# Patient Record
Sex: Female | Born: 1990 | Race: White | Hispanic: No | Marital: Single | Smoking: Former smoker
Health system: Southern US, Community
[De-identification: ages and names within clinical notes are randomized; demographics above are authoritative.]

## PROBLEM LIST (undated history)

## (undated) DIAGNOSIS — O0990 Supervision of high risk pregnancy, unspecified, unspecified trimester: Secondary | ICD-10-CM

---

## 2014-06-14 ENCOUNTER — Emergency Department (HOSPITAL_COMMUNITY)
Admission: EM | Admit: 2014-06-14 | Discharge: 2014-06-14 | Disposition: A | Discharge status: Home / Self Care | Payer: Medicaid - Out of State | Attending: Emergency Medicine | Admitting: Emergency Medicine

## 2014-06-14 ENCOUNTER — Encounter (HOSPITAL_COMMUNITY): Payer: Self-pay | Admitting: Emergency Medicine

## 2014-06-14 ENCOUNTER — Emergency Department (HOSPITAL_COMMUNITY): Payer: Medicaid - Out of State

## 2014-06-14 DIAGNOSIS — Z79899 Other long term (current) drug therapy: Secondary | ICD-10-CM | POA: Diagnosis not present

## 2014-06-14 DIAGNOSIS — O209 Hemorrhage in early pregnancy, unspecified: Secondary | ICD-10-CM | POA: Diagnosis not present

## 2014-06-14 DIAGNOSIS — Z87891 Personal history of nicotine dependence: Secondary | ICD-10-CM | POA: Insufficient documentation

## 2014-06-14 DIAGNOSIS — Z3A14 14 weeks gestation of pregnancy: Secondary | ICD-10-CM | POA: Insufficient documentation

## 2014-06-14 HISTORY — DX: Supervision of high risk pregnancy, unspecified, unspecified trimester: O09.90

## 2014-06-14 LAB — CBC WITH DIFFERENTIAL/PLATELET
Basophils Absolute: 0 10*3/uL (ref 0.0–0.1)
Basophils Relative: 0 % (ref 0–1)
Eosinophils Absolute: 0.1 10*3/uL (ref 0.0–0.7)
Eosinophils Relative: 0 % (ref 0–5)
HEMATOCRIT: 35.3 % — AB (ref 36.0–46.0)
HEMOGLOBIN: 12 g/dL (ref 12.0–15.0)
Lymphocytes Relative: 20 % (ref 12–46)
Lymphs Abs: 2.7 10*3/uL (ref 0.7–4.0)
MCH: 30 pg (ref 26.0–34.0)
MCHC: 34 g/dL (ref 30.0–36.0)
MCV: 88.3 fL (ref 78.0–100.0)
MONOS PCT: 6 % (ref 3–12)
Monocytes Absolute: 0.8 10*3/uL (ref 0.1–1.0)
NEUTROS ABS: 10.1 10*3/uL — AB (ref 1.7–7.7)
Neutrophils Relative %: 74 % (ref 43–77)
Platelets: 238 10*3/uL (ref 150–400)
RBC: 4 MIL/uL (ref 3.87–5.11)
RDW: 13 % (ref 11.5–15.5)
WBC: 13.7 10*3/uL — ABNORMAL HIGH (ref 4.0–10.5)

## 2014-06-14 LAB — WET PREP, GENITAL
CLUE CELLS WET PREP: NONE SEEN
Trich, Wet Prep: NONE SEEN
YEAST WET PREP: NONE SEEN

## 2014-06-14 LAB — BASIC METABOLIC PANEL
Anion gap: 5 (ref 5–15)
BUN: 8 mg/dL (ref 6–23)
CALCIUM: 9.1 mg/dL (ref 8.4–10.5)
CO2: 24 mmol/L (ref 19–32)
Chloride: 107 mmol/L (ref 96–112)
Creatinine, Ser: 0.61 mg/dL (ref 0.50–1.10)
GFR calc Af Amer: >90 mL/min (ref >90)
GLUCOSE: 99 mg/dL (ref 70–99)
Potassium: 3.4 mmol/L — ABNORMAL LOW (ref 3.5–5.1)
Sodium: 136 mmol/L (ref 135–145)

## 2014-06-14 LAB — URINALYSIS, ROUTINE W REFLEX MICROSCOPIC
Bilirubin Urine: NEGATIVE
Glucose, UA: NEGATIVE mg/dL
HGB URINE DIPSTICK: NEGATIVE
Ketones, ur: NEGATIVE mg/dL
Nitrite: NEGATIVE
PH: 7 (ref 5.0–8.0)
PROTEIN: NEGATIVE mg/dL
Specific Gravity, Urine: 1.02 (ref 1.005–1.030)
Urobilinogen, UA: 0.2 mg/dL (ref 0.0–1.0)

## 2014-06-14 LAB — URINE MICROSCOPIC-ADD ON

## 2014-06-14 LAB — HCG, QUANTITATIVE, PREGNANCY: HCG, BETA CHAIN, QUANT, S: 53193 m[IU]/mL — AB (ref <5)

## 2014-06-14 LAB — PROTIME-INR
INR: 1.03 (ref 0.00–1.49)
Prothrombin Time: 13.6 seconds (ref 11.6–15.2)

## 2014-06-14 NOTE — ED Notes (Signed)
Pt states that she is 4 months pregnant.  Pt states that last night, she began having sharp, low abd pain with light pink spotting and since then feels like she has been "peeing on herself consistently".

## 2014-06-14 NOTE — Discharge Instructions (Signed)
Vaginal Bleeding During Pregnancy, First Trimester  A small amount of bleeding (spotting) from the vagina is relatively common in early pregnancy. It usually stops on its own. Various things may cause bleeding or spotting in early pregnancy. Some bleeding may be related to the pregnancy, and some may not. In most cases, the bleeding is normal and is not a problem. However, bleeding can also be a sign of something serious. Be sure to tell your health care provider about any vaginal bleeding right away.  Some possible causes of vaginal bleeding during the first trimester include:  · Infection or inflammation of the cervix.  · Growths (polyps) on the cervix.  · Miscarriage or threatened miscarriage.  · Pregnancy tissue has developed outside of the uterus and in a fallopian tube (tubal pregnancy).  · Tiny cysts have developed in the uterus instead of pregnancy tissue (molar pregnancy).  HOME CARE INSTRUCTIONS   Watch your condition for any changes. The following actions may help to lessen any discomfort you are feeling:  · Follow your health care provider's instructions for limiting your activity. If your health care provider orders bed rest, you may need to stay in bed and only get up to use the bathroom. However, your health care provider may allow you to continue light activity.  · If needed, make plans for someone to help with your regular activities and responsibilities while you are on bed rest.  · Keep track of the number of pads you use each day, how often you change pads, and how soaked (saturated) they are. Write this down.  · Do not use tampons. Do not douche.  · Do not have sexual intercourse or orgasms until approved by your health care provider.  · If you pass any tissue from your vagina, save the tissue so you can show it to your health care provider.  · Only take over-the-counter or prescription medicines as directed by your health care provider.  · Do not take aspirin because it can make you  bleed.  · Keep all follow-up appointments as directed by your health care provider.  SEEK MEDICAL CARE IF:  · You have any vaginal bleeding during any part of your pregnancy.  · You have cramps or labor pains.  · You have a fever, not controlled by medicine.  SEEK IMMEDIATE MEDICAL CARE IF:   · You have severe cramps in your back or belly (abdomen).  · You pass large clots or tissue from your vagina.  · Your bleeding increases.  · You feel light-headed or weak, or you have fainting episodes.  · You have chills.  · You are leaking fluid or have a gush of fluid from your vagina.  · You pass out while having a bowel movement.  MAKE SURE YOU:  · Understand these instructions.  · Will watch your condition.  · Will get help right away if you are not doing well or get worse.  Document Released: 01/29/2005 Document Revised: 04/26/2013 Document Reviewed: 12/27/2012  [employer]® Patient Information ©2015 [employer]. This information is not intended to replace advice given to you by your health care provider. Make sure you discuss any questions you have with your health care provider.

## 2014-06-14 NOTE — ED Provider Notes (Signed)
CSN: [id number]     Arrival date & time 06/14/14  1644 History   First MD Initiated Contact with Patient 06/14/14 1706     Chief Complaint  Patient presents with  . Abdominal Pain  . 4 months Pregnant   . Vaginal Discharge     (Consider location/radiation/quality/duration/timing/severity/associated sxs/prior Treatment) HPI  Jean Walters is a(n) 24 y.o. female who presents for pain, vaginal bleeding and fluid leaking from vagina. Last known menstrual period was October 20. She is G2P0010. The patient states that last night she began having cramping in the lower abdomen and bilateral lower quadrants. She's specifies the uterus and her ovarian area. She states she was this was followed by about an hour of spotting and she began leaking clear fluid from her vagina. Specifically, she denies urinating on herself. Is from [place] and has had prenatal care in [place]. Patient states that she's had no other major problems with the pregnancy except for morning sickness. She is prescribed promethazine and states she occasionally takes 1 every few days. She denies any consistent vomiting. She is in a monogamous relationship with one partner. Denies any possibility of this sexually transmitted illness. She has no history of STDs. She denies any other drug or alcohol use except for occasional marijuana. She denies constipation, diarrhea.  Past Medical History  Diagnosis Date  . High-risk pregnancy    History reviewed. No pertinent past surgical history. History reviewed. No pertinent family history. History  Substance Use Topics  . Smoking status: Former Smoker    Types: Cigarettes  . Smokeless tobacco: Not on file  . Alcohol Use: No   OB History    Gravida Para Term Preterm AB TAB SAB Ectopic Multiple Living   1              Review of Systems  Ten systems reviewed and are negative for acute change, except as noted in the HPI.    Allergies  Review of patient's allergies indicates no known  allergies.  Home Medications   Prior to Admission medications   Medication Sig Start Date End Date Taking? Authorizing Provider  Prenatal Vit-Fe Fumarate-FA (PRENATAL MULTIVITAMIN) TABS tablet Take 1 tablet by mouth daily at 12 noon.   Yes Historical Provider, MD  promethazine (PHENERGAN) 25 MG tablet Take 25 mg by mouth every 6 (six) hours as needed for nausea or vomiting.   Yes Historical Provider, MD   BP 92/63 mmHg  Pulse 73  Temp(Src) 98.1 F (36.7 C) (Oral)  Resp 18  SpO2 100%  LMP 02/11/2014 Physical Exam  Constitutional: She is oriented to person, place, and time. She appears well-developed and well-nourished. No distress.  HENT:  Head: Normocephalic and atraumatic.  Eyes: Conjunctivae are normal. No scleral icterus.  Neck: Normal range of motion.  Cardiovascular: Normal rate, regular rhythm and normal heart sounds.  Exam reveals no gallop and no friction rub.   No murmur heard. Pulmonary/Chest: Effort normal and breath sounds normal. No respiratory distress.  Abdominal: Soft. Bowel sounds are normal. She exhibits no distension and no mass. There is no tenderness. There is no guarding.  Genitourinary:  Pelvic exam: normal external genitalia, vulva, vagina, cervix,Gravid uterus and normal adnexa.   Neurological: She is alert and oriented to person, place, and time.  Skin: Skin is warm and dry. She is not diaphoretic.  Nursing note and vitals reviewed.     ED Course  Procedures (including critical care time) Labs Review Labs Reviewed  CBC WITH DIFFERENTIAL/PLATELET -  Abnormal; Notable for the following:    WBC 13.7 (*)    HCT 35.3 (*)    Neutro Abs 10.1 (*)    All other components within normal limits  WET PREP, GENITAL  PROTIME-INR  BASIC METABOLIC PANEL  URINALYSIS, ROUTINE W REFLEX MICROSCOPIC  HCG, QUANTITATIVE, PREGNANCY  HIV ANTIBODY (ROUTINE TESTING)  RPR  ABO/RH  GC/CHLAMYDIA PROBE AMP ([place])    Imaging Review No results found.   EKG  Interpretation None      MDM   Final diagnoses:  Vaginal bleeding in pregnancy, first trimester    6:10 PM BP 92/63 mmHg  Pulse 73  Temp(Src) 98.1 F (36.7 C) (Oral)  Resp 18  SpO2 100%  LMP 02/11/2014  Patient with slight elevation in he white count. Awaiting transvaginal ultrasound and labs.   7:34 PM Patient with contaminated urine, wet prep is unremarkable. She has a mildly elevated white count. Mild hyponatremia. HCG is elevated in the range systolic by her estimated gestational age. Ultrasound shows single 14 week daily living IUP with normal placenta and normal amniotic fluid. The patient states she is Rh- (O- blood type) was given prophylactic on 05/10/2014. She produces paper as proof of her injection.   Patient appears safe for discharge at this point. I have reviewed the case with Dr. Silverio LayYAO who aggrees with assessment and POC. She appears safe fro discharge at this time to follow up with her OB gyn.  Arthor Captainbigail Lateshia Schmoker, PA-C 06/18/14 1106  Richardean Canalavid H Yao, MD 06/18/14 [phone]

## 2014-06-15 LAB — ABO/RH
ABO/RH(D): O NEG
Antibody Screen: POSITIVE
DAT, IgG: NEGATIVE

## 2014-06-15 LAB — HIV ANTIBODY (ROUTINE TESTING W REFLEX): HIV Screen 4th Generation wRfx: NONREACTIVE

## 2014-06-15 LAB — GC/CHLAMYDIA PROBE AMP (~~LOC~~) NOT AT ARMC
CHLAMYDIA, DNA PROBE: NEGATIVE
Neisseria Gonorrhea: NEGATIVE

## 2014-06-15 LAB — RPR: RPR Ser Ql: NONREACTIVE

## 2016-02-19 IMAGING — US US OB LIMITED
1 series · 14 of 18 positions shown · non-contrast
Comparison: none

CLINICAL DATA: Pelvic pain and bleeding.

EXAM:
LIMITED OBSTETRIC ULTRASOUND

[Series 1: us ob limited · 0.21mm/px · 18 acquisitions, 14 frames shown]
[im 1/18]
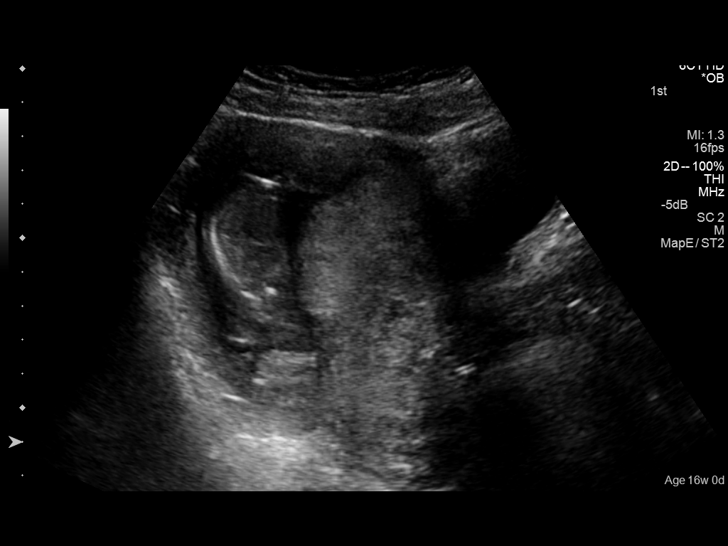
[im 2/18]
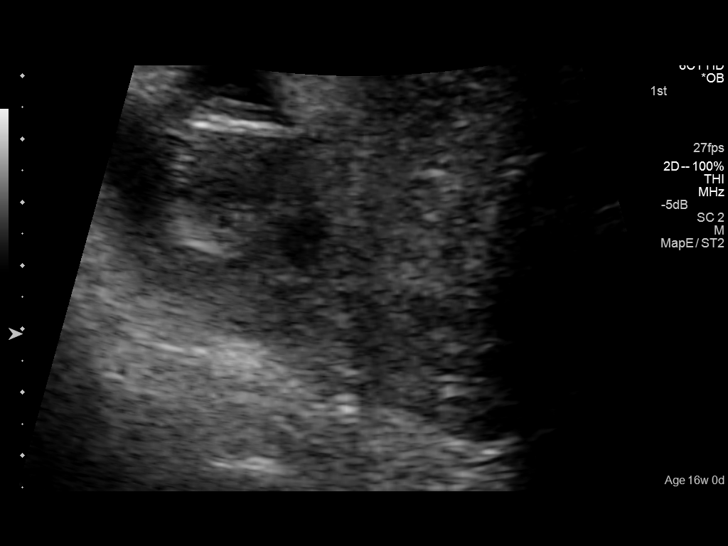
[im 4/18]
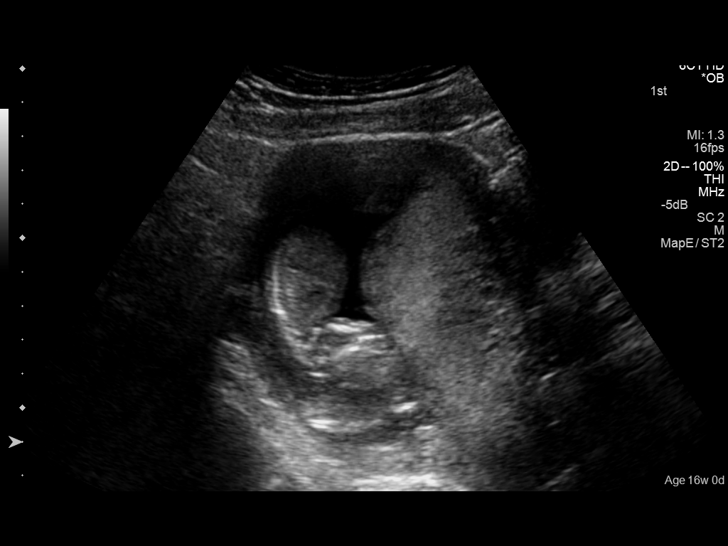
[im 5/18]
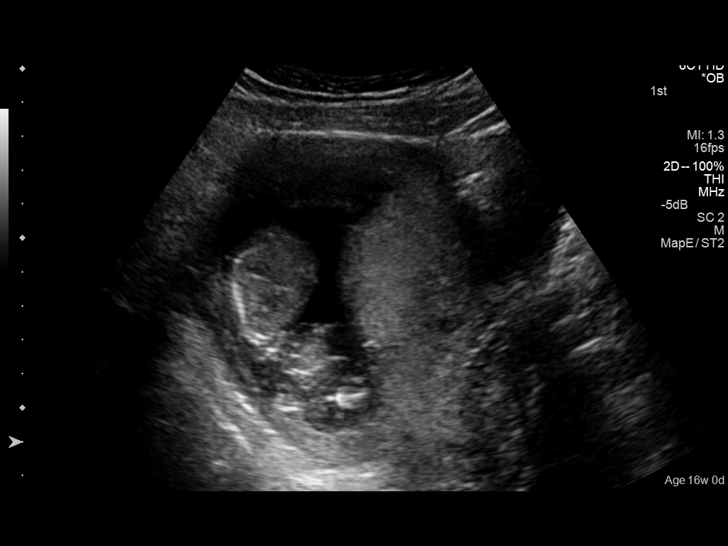
[im 6/18]
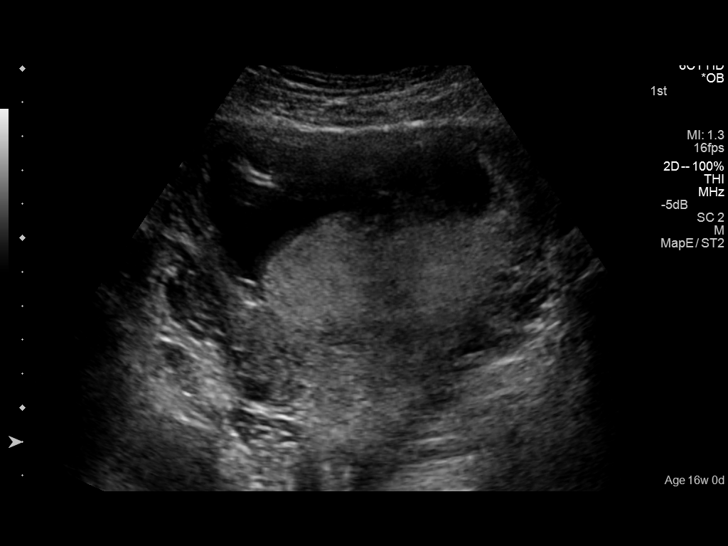
[im 8/18]
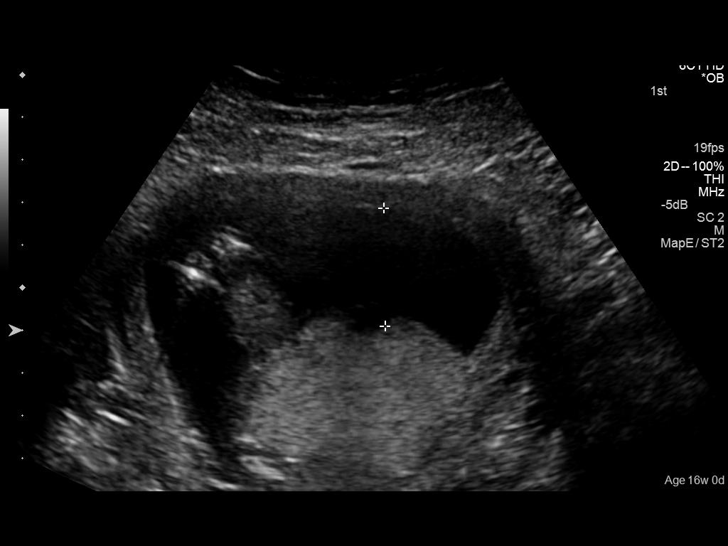
[im 9/18]
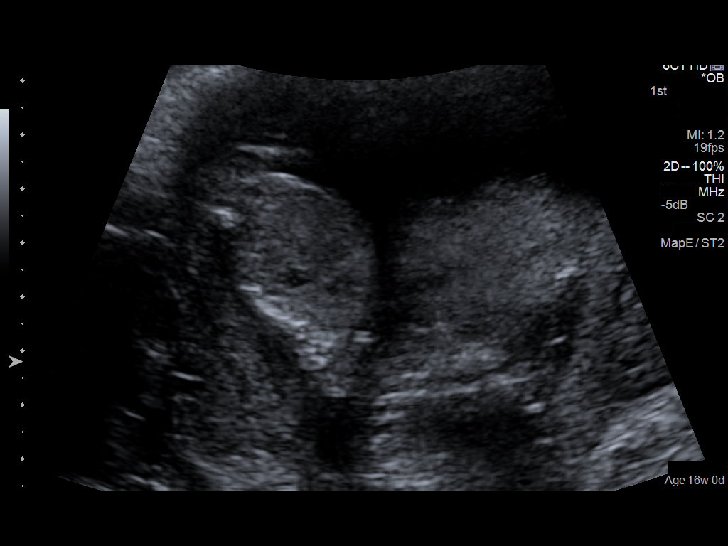
[im 10/18]
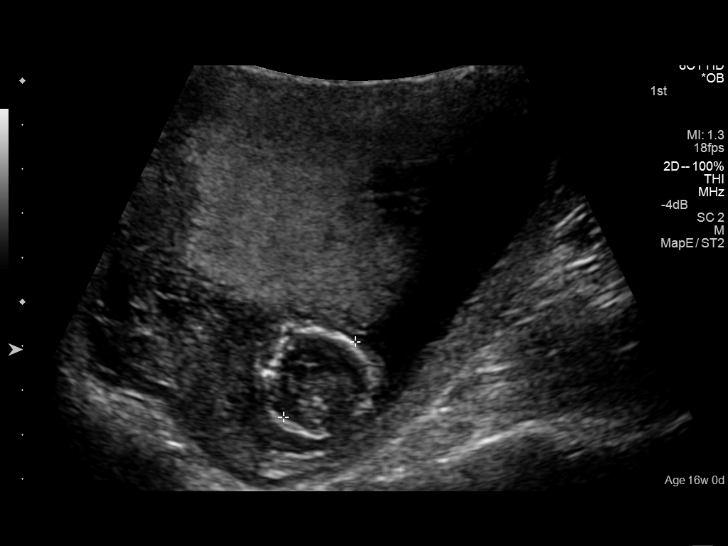
[im 11/18]
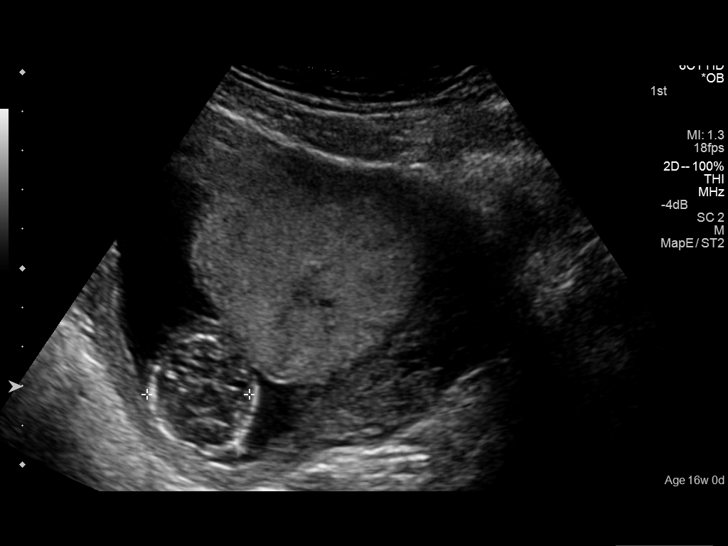
[im 13/18]
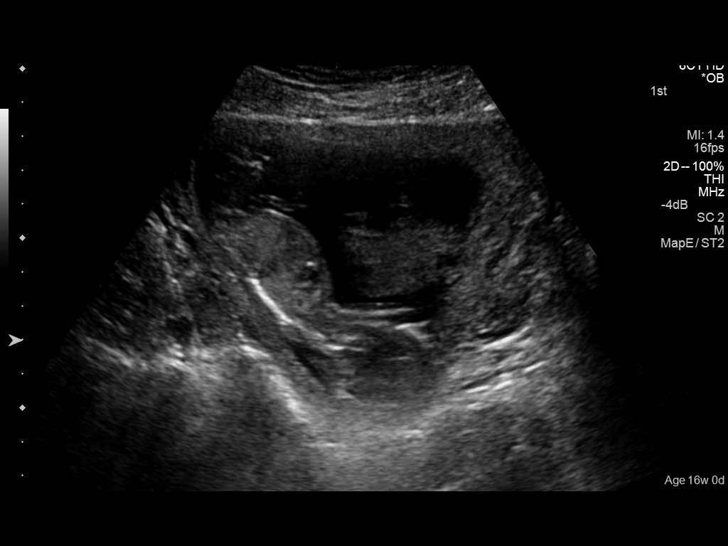
[im 14/18]
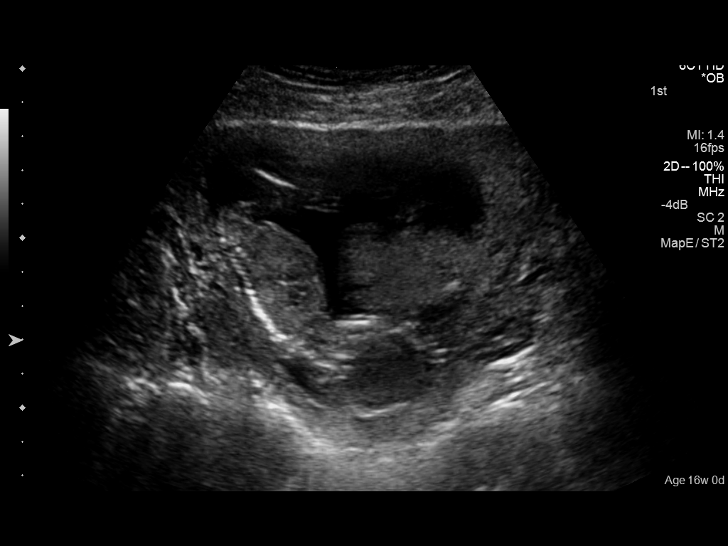
[im 15/18]
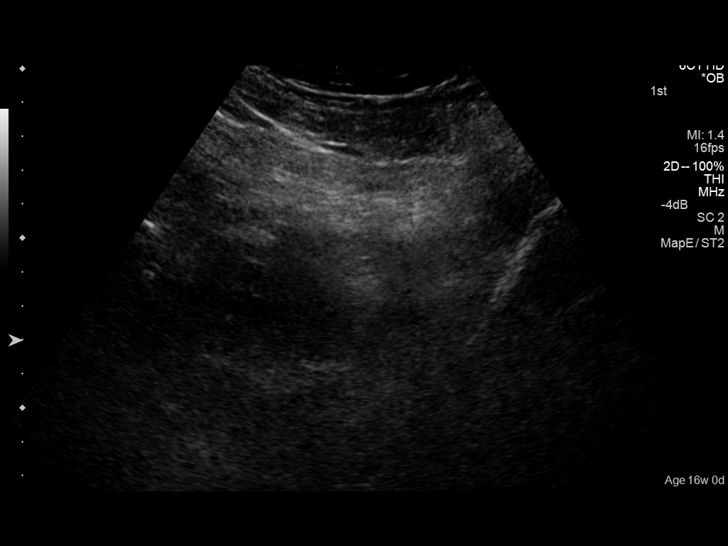
[im 17/18]
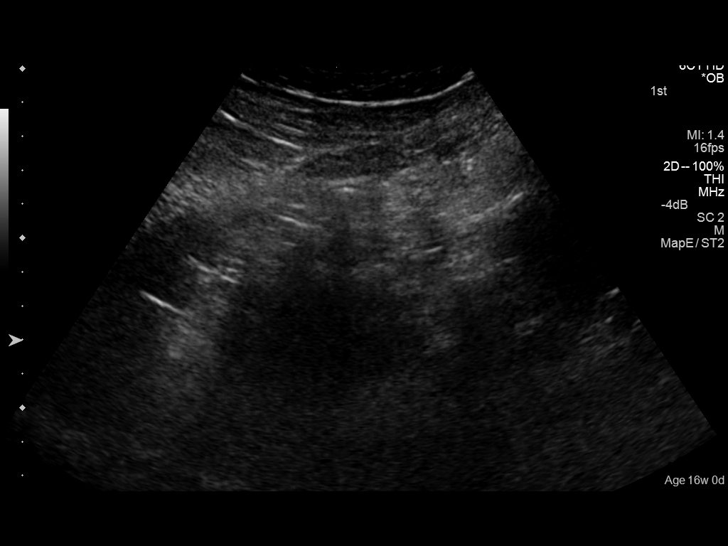
[im 18/18]
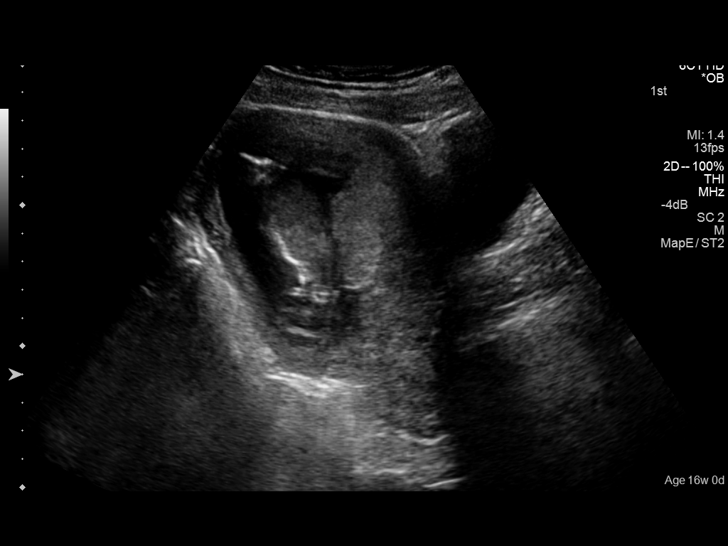

[14 of 18 positions shown; findings below may reference images not displayed]

FINDINGS: Number of Fetuses: 1

Heart Rate:  153 bpm

Movement: Yes

Presentation: Variable

Placental Location: Anterior

Previa: No

Amniotic Fluid (Subjective):  Within normal limits.

BPD:  2.48cm 14w  2d

MATERNAL FINDINGS:

Cervix:  Appears closed.

Uterus/Adnexae:  Ovaries not visualized.
IMPRESSION: Living intrauterine fetus estimated at 14 weeks and 2 days
gestation.

Normal placenta.

Normal amniotic fluid volume.

This exam is performed on an emergent basis and does not
comprehensively evaluate fetal size, dating, or anatomy; follow-up
complete OB US should be considered if further fetal assessment is
warranted.
